# Patient Record
Sex: Female | Born: 2000 | Race: White | Hispanic: No | Marital: Single | Smoking: Never smoker
Health system: Southern US, Community
[De-identification: ages and names within clinical notes are randomized; demographics above are authoritative.]

## PROBLEM LIST (undated history)

## (undated) DIAGNOSIS — F32A Depression, unspecified: Secondary | ICD-10-CM

---

## 2021-03-17 ENCOUNTER — Emergency Department: Payer: 59

## 2021-03-17 ENCOUNTER — Emergency Department
Admission: EM | Admit: 2021-03-17 | Discharge: 2021-03-17 | Disposition: A | Discharge status: Home / Self Care | Payer: 59 | Attending: Emergency Medicine | Admitting: Emergency Medicine

## 2021-03-17 ENCOUNTER — Other Ambulatory Visit: Payer: Self-pay

## 2021-03-17 DIAGNOSIS — M5126 Other intervertebral disc displacement, lumbar region: Secondary | ICD-10-CM | POA: Insufficient documentation

## 2021-03-17 DIAGNOSIS — M79604 Pain in right leg: Secondary | ICD-10-CM | POA: Diagnosis not present

## 2021-03-17 DIAGNOSIS — M79605 Pain in left leg: Secondary | ICD-10-CM | POA: Diagnosis not present

## 2021-03-17 DIAGNOSIS — M545 Low back pain, unspecified: Secondary | ICD-10-CM | POA: Diagnosis present

## 2021-03-17 HISTORY — DX: Depression, unspecified: F32.A

## 2021-03-17 LAB — POC URINE PREG, ED: Preg Test, Ur: NEGATIVE

## 2021-03-17 MED ORDER — IBUPROFEN 600 MG PO TABS: 600.0000 mg | ORAL_TABLET | Freq: Three times a day (TID) | ORAL | 0 refills | Status: AC

## 2021-03-17 MED ORDER — OXYCODONE-ACETAMINOPHEN 5-325 MG PO TABS: 1.0000 | ORAL_TABLET | Freq: Four times a day (QID) | ORAL | 0 refills | Status: AC | PRN

## 2021-03-17 MED ORDER — KETOROLAC TROMETHAMINE 60 MG/2ML IM SOLN
60.0000 mg | Freq: Once | INTRAMUSCULAR | Status: AC
  Administered 2021-03-17: 60 mg via INTRAMUSCULAR
  Filled 2021-03-17: qty 2

## 2021-03-17 MED ORDER — ONDANSETRON 4 MG PO TBDP
4.0000 mg | ORAL_TABLET | Freq: Once | ORAL | Status: AC
  Administered 2021-03-17: 4 mg via ORAL
  Filled 2021-03-17: qty 1

## 2021-03-17 MED ORDER — METHYLPREDNISOLONE 4 MG PO TBPK: ORAL_TABLET | ORAL | 0 refills | Status: DC

## 2021-03-17 MED ORDER — ONDANSETRON 4 MG PO TBDP: 4.0000 mg | ORAL_TABLET | Freq: Three times a day (TID) | ORAL | 0 refills | Status: DC | PRN

## 2021-03-17 MED ORDER — DEXAMETHASONE SODIUM PHOSPHATE 10 MG/ML IJ SOLN
10.0000 mg | Freq: Once | INTRAMUSCULAR | Status: AC
  Administered 2021-03-17: 10 mg via INTRAMUSCULAR
  Filled 2021-03-17: qty 1

## 2021-03-17 MED ORDER — OXYCODONE-ACETAMINOPHEN 5-325 MG PO TABS
2.0000 | ORAL_TABLET | Freq: Once | ORAL | Status: AC
Start: 2021-03-17 — End: 2021-03-17
  Administered 2021-03-17: 2 via ORAL
  Filled 2021-03-17: qty 2

## 2021-03-17 NOTE — ED Provider Notes (Signed)
[facility] Emergency Department Provider Note  ____________________________________________   Event Date/Time   First MD Initiated Contact with Patient 03/17/21 1440     (approximate)  I have reviewed the triage vital signs and the nursing notes.   HISTORY  Chief Complaint Back Pain    HPI Tina Soto is a 20 y.o. female here with back pain.  The patient is a Psychologist, forensic.  She was training today when she threw a medicine ball sideways as part of her workout.  She reports immediate onset of severe, 10 of 10, lower back pain.  She immediately went to her trainer and they tried to work her back at home without significant relief.  She has subsequently had ongoing, severe, lower back pain.  She has had occasional radiation down the legs.  Denies any alleviating factors other than rest.  No history of back injuries.  No other complaints.  No loss of bowel or bladder function.    Past Medical History:  Diagnosis Date   Depression     There are no problems to display for this patient.   History reviewed. No pertinent surgical history.  Prior to Admission medications   Medication Sig Start Date End Date Taking? Authorizing Provider  ibuprofen (ADVIL) 600 MG tablet Take 1 tablet (600 mg total) by mouth 3 (three) times daily for 7 days. 03/17/21 03/24/21 Yes Shaune Pollack, MD  methylPREDNISolone (MEDROL DOSEPAK) 4 MG TBPK tablet Take as directed on packaging 03/17/21  Yes Shaune Pollack, MD  ondansetron (ZOFRAN ODT) 4 MG disintegrating tablet Take 1 tablet (4 mg total) by mouth every 8 (eight) hours as needed for nausea or vomiting. 03/17/21  Yes Shaune Pollack, MD  oxyCODONE-acetaminophen (PERCOCET) 5-325 MG tablet Take 1-2 tablets by mouth every 6 (six) hours as needed for severe pain (no more than 6 tabs daily). 03/17/21 03/17/22 Yes Shaune Pollack, MD    Allergies Patient has no known allergies.  History reviewed. No pertinent family  history.  Social History Social History   Tobacco Use   Smoking status: Never   Smokeless tobacco: Never    Review of Systems  Review of Systems  Constitutional:  Negative for chills and fever.  HENT:  Negative for sore throat.   Respiratory:  Negative for shortness of breath.   Cardiovascular:  Negative for chest pain.  Gastrointestinal:  Negative for abdominal pain.  Genitourinary:  Negative for flank pain.  Musculoskeletal:  Positive for back pain and gait problem (Due to pain). Negative for neck pain.  Skin:  Negative for rash and wound.  Allergic/Immunologic: Negative for immunocompromised state.  Neurological:  Negative for weakness and numbness.  Hematological:  Does not bruise/bleed easily.  All other systems reviewed and are negative.   ____________________________________________  PHYSICAL EXAM:      VITAL SIGNS: ED Triage Vitals  Enc Vitals Group     BP 03/17/21 1339 115/71     Pulse Rate 03/17/21 1339 84     Resp 03/17/21 1745 18     Temp 03/17/21 1339 98.3 F (36.8 C)     Temp Source 03/17/21 1339 Oral     SpO2 03/17/21 1339 98 %     Weight 03/17/21 1340 155 lb (70.3 kg)     Height 03/17/21 1340 6\' 1"  (1.854 m)     Head Circumference --      Peak Flow --      Pain Score 03/17/21 1332 8     Pain Loc --  Pain Edu? --      Excl. in GC? --      Physical Exam Vitals and nursing note reviewed.  Constitutional:      General: She is not in acute distress.    Appearance: She is well-developed.  HENT:     Head: Normocephalic and atraumatic.  Eyes:     Conjunctiva/sclera: Conjunctivae normal.  Cardiovascular:     Rate and Rhythm: Normal rate and regular rhythm.     Heart sounds: Normal heart sounds.  Pulmonary:     Effort: Pulmonary effort is normal. No respiratory distress.     Breath sounds: No wheezing.  Abdominal:     General: There is no distension.  Musculoskeletal:     Cervical back: Neck supple.     Comments: Marked lower back pain,  worse over lumbosacral lower spine.  Skin:    General: Skin is warm.     Capillary Refill: Capillary refill takes less than 2 seconds.     Findings: No rash.  Neurological:     Mental Status: She is alert and oriented to person, place, and time.     Motor: No abnormal muscle tone.     Comments: Strength 5/5 bilateral LE, including distal extremities. Sensation to light touch is intact. Quad, achilles reflexes 2+ and symmetric.      ____________________________________________   LABS (all labs ordered are listed, but only abnormal results are displayed)  Labs Reviewed  POC URINE PREG, ED    ____________________________________________  EKG:  ________________________________________  RADIOLOGY All imaging, including plain films, CT scans, and ultrasounds, independently reviewed by me, and interpretations confirmed via formal radiology reads.  ED MD interpretation:   MR Spine Lumbar: Disc protrusions at L4-5 and L5-S1, with mild lateral recess stenosis  Official radiology report(s): MR LUMBAR SPINE WO CONTRAST  Result Date: 03/17/2021 CLINICAL DATA:  Low back pain. Trauma. Twisting motion with a medicine ball. EXAM: MRI LUMBAR SPINE WITHOUT CONTRAST TECHNIQUE: Multiplanar, multisequence MR imaging of the lumbar spine was performed. No intravenous contrast was administered. COMPARISON:  None. FINDINGS: Segmentation: Normal lumbar segmentation is assumed with the lowest fully formed disc space designated L5-S1. Alignment:  Trace retrolisthesis of L5 on S1. Vertebrae: No fracture, suspicious marrow lesion, or significant marrow edema. Conus medullaris and cauda equina: Conus extends to the L1 level. Conus and cauda equina appear normal. Paraspinal and other soft tissues: Unremarkable. Disc levels: L1-2: Negative. L2-3 and L3-4: Normal discs. Mild facet hypertrophy without stenosis. L4-5: Disc desiccation and mild disc space narrowing. A central disc protrusion and mild right and  mild-to-moderate left facet hypertrophy result in mild spinal and bilateral lateral recess stenosis. Patent neural foramina. L5-S1: Disc desiccation and mild disc space narrowing. A central to left paracentral disc protrusion and slight facet hypertrophy result in mild left lateral recess stenosis without significant generalized spinal stenosis or neural foraminal stenosis. IMPRESSION: 1. Disc protrusions at L4-5 and L5-S1 with mild lateral recess stenosis. 2. Mild multilevel facet hypertrophy. Electronically Signed   By: Sebastian Ache M.D.   On: 03/17/2021 16:54    ____________________________________________  PROCEDURES   Procedure(s) performed (including Critical Care):  Procedures  ____________________________________________  INITIAL IMPRESSION / MDM / ASSESSMENT AND PLAN / ED COURSE  As part of my medical decision making, I reviewed the following data within the electronic MEDICAL RECORD NUMBER Nursing notes reviewed and incorporated, Old chart reviewed, Notes from prior ED visits, and [place] Controlled Substance Database       *Tina Soto was  evaluated in Emergency Department on 03/17/2021 for the symptoms described in the history of present illness. She was evaluated in the context of the global COVID-19 pandemic, which necessitated consideration that the patient might be at risk for infection with the SARS-CoV-2 virus that causes COVID-19. Institutional protocols and algorithms that pertain to the evaluation of patients at risk for COVID-19 are in a state of rapid change based on information released by regulatory bodies including the CDC and federal and state organizations. These policies and algorithms were followed during the patient's care in the ED.  Some ED evaluations and interventions may be delayed as a result of limited staffing during the pandemic.*     Medical Decision Making:  20 yo F here with severe lower back pain. No LE weakness, numbness, loss of bowel or bladder or signs  of cauda equina. Given degree of pain, MR obtained and shows central disc protrusions at L4-5 and L5-S1. Discussed with Dr. Myer Haff via telephone. Will place on steroids, d/c with analgesia, and outpt follow-up. Pt and her trainer notified and in agreement with this plan.  ____________________________________________  FINAL CLINICAL IMPRESSION(S) / ED DIAGNOSES  Final diagnoses:  Lumbar disc herniation     MEDICATIONS GIVEN DURING THIS VISIT:  Medications  dexamethasone (DECADRON) injection 10 mg (has no administration in time range)  ketorolac (TORADOL) injection 60 mg (60 mg Intramuscular Given 03/17/21 1535)  ondansetron (ZOFRAN-ODT) disintegrating tablet 4 mg (4 mg Oral Given 03/17/21 1535)  oxyCODONE-acetaminophen (PERCOCET/ROXICET) 5-325 MG per tablet 2 tablet (2 tablets Oral Given 03/17/21 1535)     ED Discharge Orders          Ordered    methylPREDNISolone (MEDROL DOSEPAK) 4 MG TBPK tablet        03/17/21 1753    oxyCODONE-acetaminophen (PERCOCET) 5-325 MG tablet  Every 6 hours PRN        03/17/21 1753    ondansetron (ZOFRAN ODT) 4 MG disintegrating tablet  Every 8 hours PRN        03/17/21 1753    ibuprofen (ADVIL) 600 MG tablet  3 times daily        03/17/21 1753             Note:  This document was prepared using Dragon voice recognition software and may include unintentional dictation errors.   Shaune Pollack, MD 03/17/21 [phone]

## 2021-03-17 NOTE — ED Triage Notes (Signed)
Pt comes into the ED via [facility] from school c/o low back pain.  PT states she was doing a twisting motion with a medicine ball and now she has low back pain and an all over "heaviness". 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    128/70 70 HR

## 2021-08-31 DIAGNOSIS — Z87820 Personal history of traumatic brain injury: Secondary | ICD-10-CM | POA: Insufficient documentation

## 2022-03-23 DIAGNOSIS — M5416 Radiculopathy, lumbar region: Secondary | ICD-10-CM | POA: Insufficient documentation

## 2022-04-21 ENCOUNTER — Encounter: Payer: Self-pay | Admitting: Family Medicine

## 2022-04-21 ENCOUNTER — Other Ambulatory Visit: Payer: Self-pay | Admitting: Family Medicine

## 2022-04-21 DIAGNOSIS — F4323 Adjustment disorder with mixed anxiety and depressed mood: Secondary | ICD-10-CM

## 2022-04-21 DIAGNOSIS — F4310 Post-traumatic stress disorder, unspecified: Secondary | ICD-10-CM

## 2022-04-21 DIAGNOSIS — F5105 Insomnia due to other mental disorder: Secondary | ICD-10-CM

## 2022-04-21 HISTORY — DX: Adjustment disorder with mixed anxiety and depressed mood: F43.23

## 2022-04-21 HISTORY — DX: Insomnia due to other mental disorder: F51.05

## 2022-05-09 ENCOUNTER — Ambulatory Visit (INDEPENDENT_AMBULATORY_CARE_PROVIDER_SITE_OTHER): Payer: BC Managed Care – PPO | Admitting: Family Medicine

## 2022-05-09 ENCOUNTER — Other Ambulatory Visit: Payer: Self-pay

## 2022-05-09 ENCOUNTER — Encounter: Payer: Self-pay | Admitting: Family Medicine

## 2022-05-09 VITALS — BP 122/58 | HR 113 | Temp 99.3°F | Resp 18 | Ht 73.0 in | Wt 158.0 lb

## 2022-05-09 DIAGNOSIS — N760 Acute vaginitis: Secondary | ICD-10-CM

## 2022-05-09 LAB — POCT WET PREP (WET MOUNT)
Clue Cells Wet Prep Whiff POC: POSITIVE
Trichomonas Wet Prep HPF POC: ABSENT

## 2022-05-09 MED ORDER — METRONIDAZOLE 500 MG PO TABS: 500.0000 mg | ORAL_TABLET | Freq: Two times a day (BID) | ORAL | 0 refills | Status: AC

## 2022-05-09 NOTE — Progress Notes (Signed)
[facility] [address] [place], [place] [address] Phone: [phone] Fax: [phone]   Office Visit Note  Patient Name: Tina Soto  Date of [id number]:[id number]  Med Rec number [id number]  Date of Service: 05/09/2022  Patient has no known allergies.  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   traZODone (DESYREL) 50 MG tablet TAKE 1 AT BEDTIME AS NEEDED   [DISCONTINUED] ondansetron (ZOFRAN ODT) 4 MG disintegrating tablet Take 1 tablet (4 mg total) by mouth every 8 (eight) hours as needed for nausea or vomiting.   No facility-administered encounter medications on file as of 05/09/2022.      Medical History: Past Medical History:  Diagnosis Date   Adjustment disorder with mixed anxiety and depressed mood 04/21/2022   Depression    Insomnia related to another mental disorder 04/21/2022     Vital Signs: BP (!) 122/58   Pulse (!) 113   Temp 99.3 F (37.4 C) (Tympanic)   Resp 18   Ht 6\' 1"  (1.854 m)   Wt 158 lb (71.7 kg)   SpO2 99%   BMI 20.85 kg/m    Review of Systems  Constitutional: Negative.   Genitourinary:        See HPI  Musculoskeletal:        See HPI    Physical Exam Constitutional:      Appearance: Normal appearance. She is normal  weight.  Genitourinary:    Exam position: Knee-chest position.     Labia:        Right: No rash.        Left: No rash.      Comments: Watery discharge Neurological:     Mental Status: She is alert.     Assessment/Plan:  1. Acute vaginitis Has recurrent bacterial vaginitis - metroNIDAZOLE (FLAGYL) 500 MG tablet; Take 1 tablet (500 mg total) by mouth 2 (two) times daily for 7 days.  Dispense: 14 tablet; Refill: 0 - Chlamydia/Gonococcus/Trichomonas, NAA - POCT Wet Prep [facility])    Results for orders placed or performed in visit on 05/09/22 (from the past 24 hour(s))  POCT Wet Prep [facility])     Status: Abnormal   Collection Time: 05/09/22  3:46 PM  Result Value Ref Range   Source Wet Prep POC vagina    WBC, Wet Prep HPF POC     Bacteria Wet Prep HPF POC Few Few   BACTERIA WET PREP MORPHOLOGY POC     Clue Cells Wet Prep HPF POC Few (A) None   Clue Cells Wet Prep Whiff POC Positive Whiff    Yeast Wet Prep HPF POC None None   KOH Wet Prep POC None None   Trichomonas Wet Prep HPF POC Absent Absent  2. Recheck for gonorrhea and chlamydia and include trichomonas     General Counseling: Aaleyah verbalizes understanding of the findings of todays visit and agrees with plan of treatment. I have discussed any further diagnostic evaluation that may be needed or ordered today. We also reviewed her medications today. she has been encouraged to call the office with any questions or concerns that should arise related to todays visit.   Orders Placed This Encounter  Procedures   Chlamydia/Gonococcus/Trichomonas, NAA   POCT Wet Prep ([place] [place])    Meds ordered this encounter  Medications   metroNIDAZOLE (FLAGYL) 500 MG tablet    Sig: Take 1 tablet (500 mg total) by mouth 2 (two) times daily for 7 days.    Dispense:  14 tablet    Refill:  0     Dr Evette Doffing Fordyce Lepak ABFM University Physician

## 2022-05-11 LAB — CHLAMYDIA/GONOCOCCUS/TRICHOMONAS, NAA
Chlamydia by NAA: NEGATIVE
Gonococcus by NAA: NEGATIVE
Trich vag by NAA: NEGATIVE

## 2022-09-26 ENCOUNTER — Encounter: Payer: Self-pay | Admitting: Family Medicine

## 2022-09-26 ENCOUNTER — Ambulatory Visit (INDEPENDENT_AMBULATORY_CARE_PROVIDER_SITE_OTHER): Payer: BC Managed Care – PPO | Admitting: Family Medicine

## 2022-09-26 VITALS — BP 112/64 | HR 89 | Temp 97.6°F | Resp 18 | Ht 73.0 in | Wt 160.0 lb

## 2022-09-26 DIAGNOSIS — B351 Tinea unguium: Secondary | ICD-10-CM

## 2022-09-26 MED ORDER — TERBINAFINE HCL 250 MG PO TABS: 250.0000 mg | ORAL_TABLET | Freq: Every day | ORAL | 0 refills | Status: AC

## 2022-09-26 NOTE — Progress Notes (Signed)
[address] [place], [place] [address] Phone: [phone] Fax: [phone]   Office Visit Note  Patient Name: Tina Soto  Date of [id number]  Med Rec number [id number]  Date of Service: 09/26/2022  Patient has no known allergies.  Chief Complaint  Patient presents with   Nail Problem     Noticed discoloration and cracking of toenails in December Has used an OTC antifungal pen with no response Has had pedicures in the past but none recently      Current Medication:  Outpatient Encounter Medications as of 09/26/2022  Medication Sig   citalopram (CELEXA) 20 MG tablet TAKE 1 TABLET BY MOUTH DAILYWITH BREAKFAST   traZODone (DESYREL) 50 MG tablet TAKE 1 AT BEDTIME AS NEEDED   [DISCONTINUED] methylPREDNISolone (MEDROL DOSEPAK) 4 MG TBPK tablet Take as directed on packaging   No facility-administered encounter medications on file as of 09/26/2022.      Medical History: Past Medical History:  Diagnosis Date   Adjustment disorder with mixed anxiety and depressed mood 04/21/2022   Depression    Insomnia related to another mental disorder 04/21/2022     Vital Signs: BP 112/64   Pulse 89   Temp 97.6 F (36.4 C) (Tympanic)   Resp 18   Ht 6' 1"$  (1.854 m)   Wt 160 lb (72.6 kg)   SpO2 99%   BMI 21.11 kg/m    Review of Systems  Constitutional: Negative.   Skin:        See HPI    Physical Exam Feet:     Right foot:     Skin integrity: Fissure present.     Toenail Condition: Fungal disease present.    Left foot:     Skin integrity: Fissure present.     Toenail Condition: Fungal disease present.    Comments: Toes 3, 4, 4 and 5 of both feet with white linear changes and some yellowing and thickening consistent with fungal infection  Minimal cracking and fissure between toes   Assessment/Plan:  1. Onychomycosis Tina Soto is aware that toenails grow very slowly so it will take 3 months or more before all nails are back to normal and  she will not use nail polish until they are normal again - terbinafine (LAMISIL) 250 MG tablet; Take 1 tablet (250 mg total) by mouth daily.  Dispense: 90 tablet; Refill: 0      General Counseling: Tina Soto verbalizes understanding of the findings of todays visit and agrees with plan of treatment. I have discussed any further diagnostic evaluation that may be needed or ordered today. We also reviewed her medications today. she has been encouraged to call the office with any questions or concerns that should arise related to todays visit.   No orders of the defined types were placed in this encounter.   No orders of the defined types were placed in this encounter.    Dr Evette Doffing Ayelen Sciortino ABFM University Physician

## 2022-11-28 ENCOUNTER — Encounter: Payer: Self-pay | Admitting: Family Medicine

## 2022-11-28 ENCOUNTER — Ambulatory Visit (INDEPENDENT_AMBULATORY_CARE_PROVIDER_SITE_OTHER): Payer: BC Managed Care – PPO | Admitting: Family Medicine

## 2022-11-28 VITALS — BP 122/74 | HR 73 | Temp 97.9°F

## 2022-11-28 DIAGNOSIS — Z30431 Encounter for routine checking of intrauterine contraceptive device: Secondary | ICD-10-CM | POA: Diagnosis not present

## 2022-11-28 NOTE — Progress Notes (Signed)
[facility] [address] [place], [place] [address] Phone: [phone] Fax: [phone]   Office Visit Note  Patient Name: Tina Soto  Date of [id number]  Med Rec number [id number]  Date of Service: 11/28/2022  Patient has no known allergies.  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   citalopram (CELEXA) 20 MG tablet TAKE 1 TABLET BY MOUTH DAILYWITH BREAKFAST (Patient not taking: Reported on 11/28/2022)   traZODone (DESYREL) 50 MG tablet TAKE 1 AT BEDTIME AS NEEDED (Patient not taking: Reported on 11/28/2022)   No facility-administered encounter medications on file as of 11/28/2022.      Medical History: Past Medical History:  Diagnosis Date   Adjustment disorder with mixed anxiety and depressed mood 04/21/2022   Depression    Insomnia related to another mental disorder 04/21/2022     Vital Signs: BP 122/74   Pulse 73   Temp 97.9 F (36.6 C) (Tympanic)   SpO2 98%    Review of Systems  Genitourinary:  Positive for dyspareunia.  Psychiatric/Behavioral:         Doing well Takes trazodone every few weeks only    Physical Exam Vitals reviewed.  Constitutional:      Appearance: Normal appearance.  Genitourinary:    Exam position: Lithotomy position.     Vagina: Normal.     Cervix: Normal.     Uterus: Normal.      Comments: String visible, no dilation of external os - small nabothian cyst String length normal IUD end not palpable Neurological:     Mental Status: She is alert.      Assessment/Plan:  1. IUD check up No concerns      General Counseling: Tina Soto verbalizes understanding of the findings of todays visit and agrees with plan of treatment. I have discussed any further diagnostic evaluation that may be needed or ordered today. We also reviewed her medications today. she has been encouraged to call the office with any questions or concerns that should arise related to todays visit.   No orders of the defined types were placed in this encounter.   No orders of the defined types were placed in this encounter.   Dr Durwin Reges Aylinn Rydberg ABFM University Physician

## 2022-12-06 ENCOUNTER — Other Ambulatory Visit: Payer: Self-pay | Admitting: Family Medicine

## 2022-12-06 ENCOUNTER — Encounter: Payer: Self-pay | Admitting: Family Medicine

## 2022-12-06 DIAGNOSIS — Z2839 Other underimmunization status: Secondary | ICD-10-CM

## 2022-12-14 ENCOUNTER — Encounter: Payer: Self-pay | Admitting: Family Medicine

## 2022-12-14 ENCOUNTER — Ambulatory Visit (INDEPENDENT_AMBULATORY_CARE_PROVIDER_SITE_OTHER): Payer: BC Managed Care – PPO | Admitting: Family Medicine

## 2022-12-14 VITALS — BP 120/72 | HR 111 | Temp 98.8°F | Ht 73.0 in | Wt 152.0 lb

## 2022-12-14 DIAGNOSIS — J069 Acute upper respiratory infection, unspecified: Secondary | ICD-10-CM | POA: Diagnosis not present

## 2022-12-14 DIAGNOSIS — R29898 Other symptoms and signs involving the musculoskeletal system: Secondary | ICD-10-CM

## 2022-12-14 DIAGNOSIS — H9202 Otalgia, left ear: Secondary | ICD-10-CM

## 2022-12-14 NOTE — Progress Notes (Signed)
[facility] [address] [place], [place] [address] Phone: [phone] Fax: [phone]   Office Visit Note  Patient Name: Tina Soto  Date of [id number]  Med Rec number [id number]  Date of Service: 12/14/2022  Patient has no known allergies.  Chief Complaint  Patient presents with   Ear Pain    Left      Has had head fog for a few days and then left ear pain today No alteration in hearing  No fever Feels somewhat better after OTC cold and sinus medication this morning  Also has intermittent recurrnet jaw pain      Current Medication:  Outpatient Encounter Medications as of 12/14/2022  Medication Sig   terbinafine (LAMISIL) 250 MG tablet Take 1 tablet (250 mg total) by mouth daily. (Patient not taking: Reported on 12/14/2022)   traZODone (DESYREL) 50 MG tablet TAKE 1 AT BEDTIME AS NEEDED (Patient not taking: Reported on 11/28/2022)   No facility-administered encounter medications on file as of 12/14/2022.      Medical History: Past Medical History:  Diagnosis Date   Adjustment disorder with mixed anxiety and depressed mood 04/21/2022   Depression    Insomnia related to another mental disorder 04/21/2022     Vital Signs: BP 120/72   Pulse (!) 111   Temp 98.8 F (37.1 C) (Tympanic)   Ht 6\' 1"  (1.854 m)   Wt 152 lb (68.9 kg)   SpO2 99%   BMI 20.05 kg/m    Review of Systems  Constitutional:  Negative for fever.  HENT:  Positive for ear pain.   Genitourinary:  Negative for genital sores.    Physical Exam Vitals reviewed.  Constitutional:      Appearance: Normal appearance.  HENT:     Head:     Jaw: Tenderness and pain on movement present.     Comments: TMJ clicking especially on left    Right Ear: A middle ear effusion is present.     Left Ear: A middle ear effusion is present.     Nose: Rhinorrhea present. Rhinorrhea is clear.     Right Turbinates: Swollen.     Left Turbinates: Swollen.     Right Sinus: Maxillary sinus  tenderness present. No frontal sinus tenderness.     Left Sinus: Maxillary sinus tenderness present. No frontal sinus tenderness.     Mouth/Throat:     Pharynx: Oropharynx is clear. Uvula midline. No oropharyngeal exudate, posterior oropharyngeal erythema or uvula swelling.  Eyes:     Conjunctiva/sclera: Conjunctivae normal.  Pulmonary:     Effort: Pulmonary effort is normal.     Breath sounds: Normal breath sounds and air entry.  Neurological:     Mental Status: She is alert.      Assessment/Plan:  1. Otalgia of left ear Due to pressure from fluid and referred from TMJ  2. Acute upper respiratory infection Viral - use fluticasone nasal spray, 2 sprays to each nostril two times daily Continue OTC cold meds  3. TMJ click stable      General Counseling: Tina Soto verbalizes understanding of the findings of todays visit and agrees with plan of treatment. I have discussed any further diagnostic evaluation that may be needed or ordered today. We also reviewed her medications today. she has been encouraged to call the office with any questions or concerns that should arise related to todays visit.   No orders of the defined types were placed in this encounter.   No orders of the  defined types were placed in this encounter.    Dr Evette Doffing Luke Rigsbee ABFM University Physician

## 2023-03-14 IMAGING — MR MR LUMBAR SPINE W/O CM
5 series · 31 of 48 positions shown · non-contrast
Comparison: None.

CLINICAL DATA: Low back pain. Trauma. Twisting motion with a
medicine ball.

EXAM:
MRI LUMBAR SPINE WITHOUT CONTRAST
TECHNIQUE: Multiplanar, multisequence MR imaging of the lumbar spine was
performed. No intravenous contrast was administered.

[Series 5: T2 · sagittal · 4.0mm · 0.81mm/px · 6 of 17 slices shown (1 of 2)]
[im 1/17]
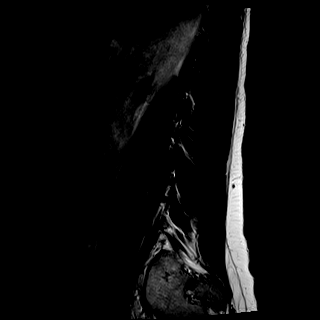
[im 4/17]
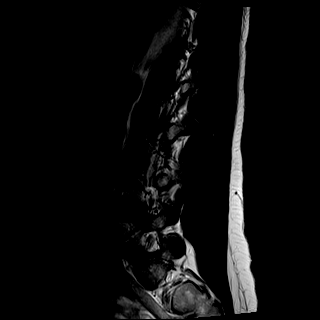
[im 7/17]
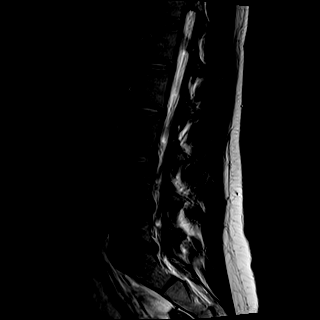
[im 10/17]
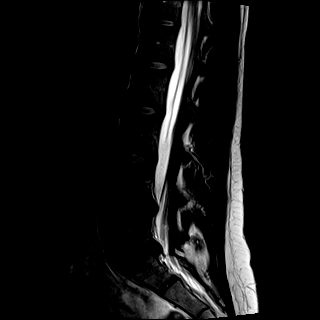
[im 13/17]
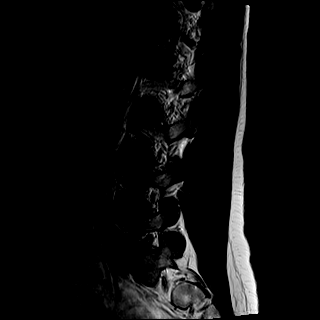
[im 17/17]
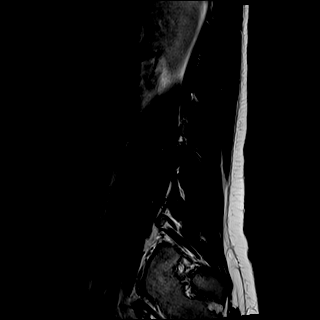

[Series 6: T1 · sagittal · 4.0mm · 0.81mm/px · 7 of 17 slices shown (1 of 2)]
[im 1/17]
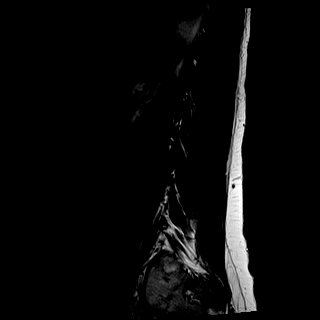
[im 3/17]
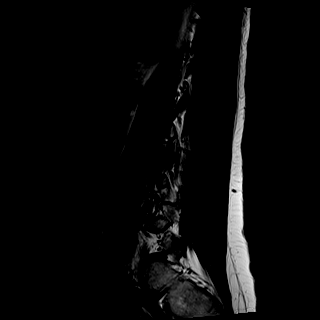
[im 6/17]
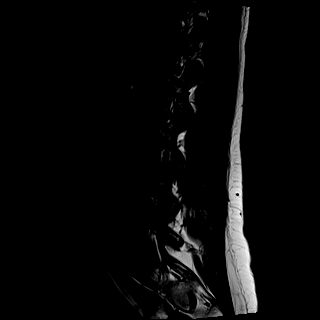
[im 9/17]
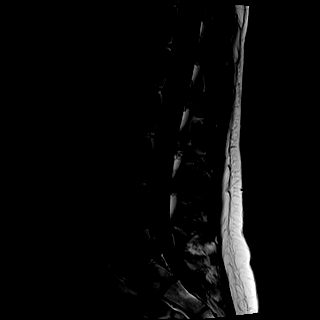
[im 11/17]
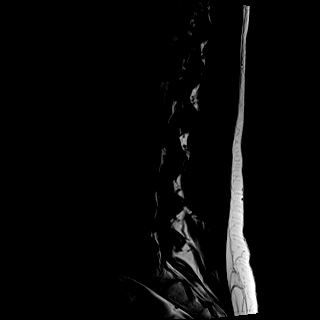
[im 14/17]
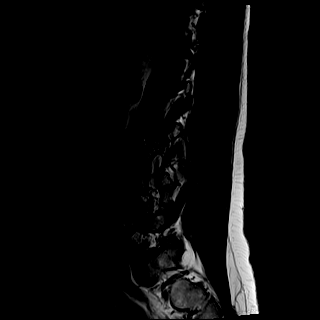
[im 17/17]
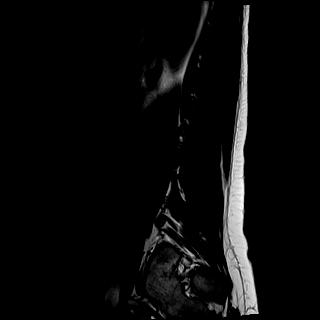

[Series 7: STIR · sagittal · 4.0mm · 0.41mm/px · 2 of 17 slices shown]
[im 1/17]
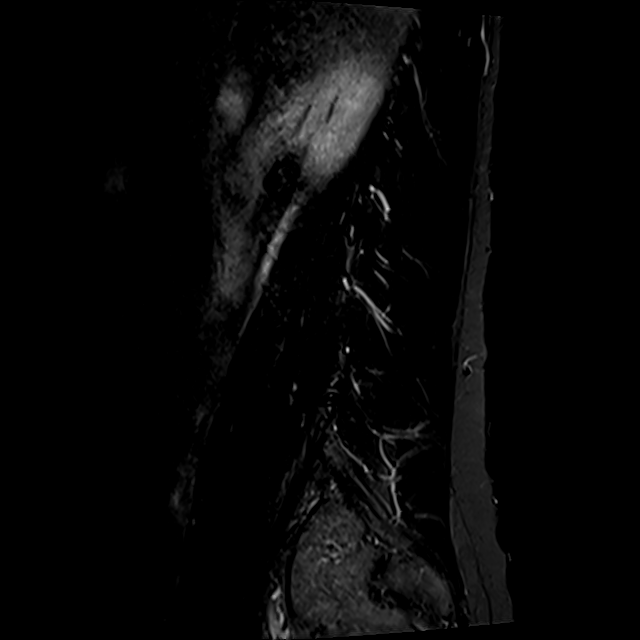
[im 3/17]
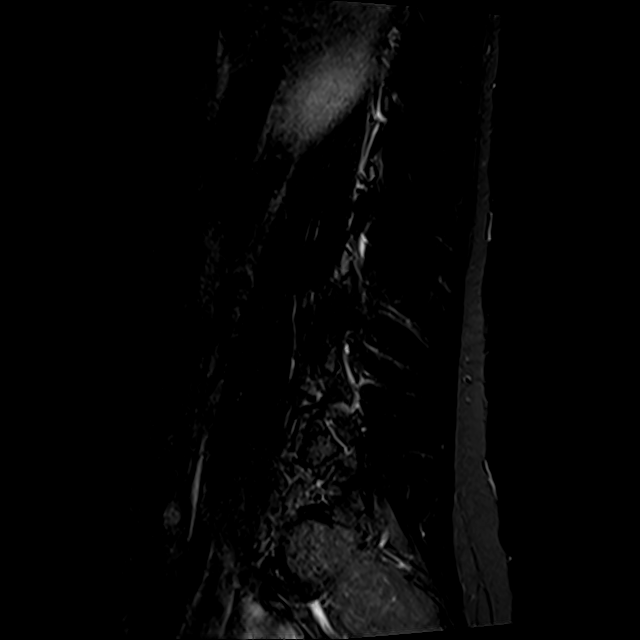

[Series 8: T2 · axial · 4.0mm · 0.78mm/px · z∈[-88,+109]mm · 8 of 36 slices shown (2 of 2)]
[im 1/36]
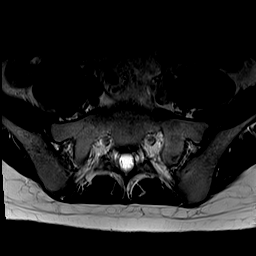
[im 6/36]
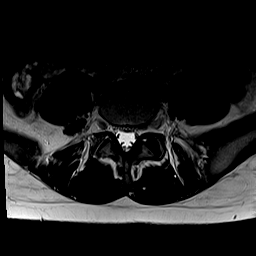
[im 11/36]
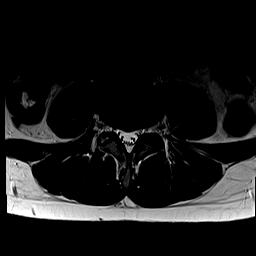
[im 17/36]
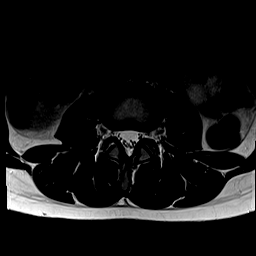
[im 19/36]
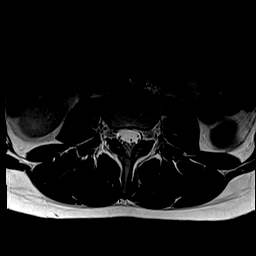
[im 25/36]
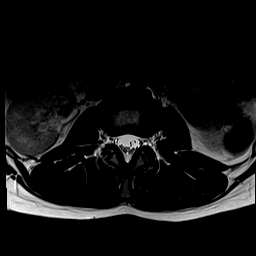
[im 30/36]
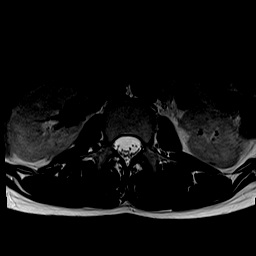
[im 36/36]
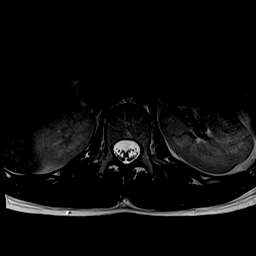

[Series 9: T1 · axial · 4.0mm · 0.39mm/px · z∈[-88,+109]mm · 8 of 36 slices shown (2 of 2)]
[im 1/36]
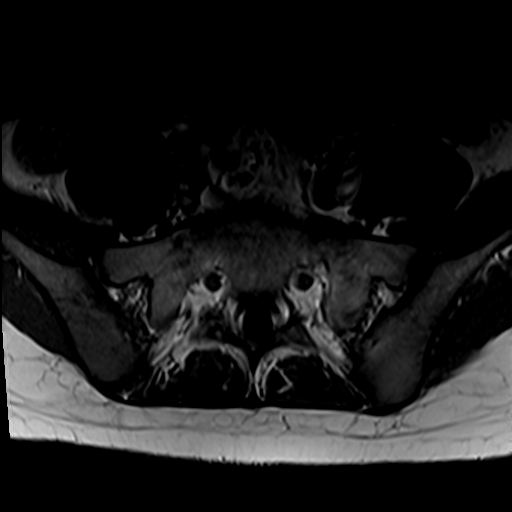
[im 6/36]
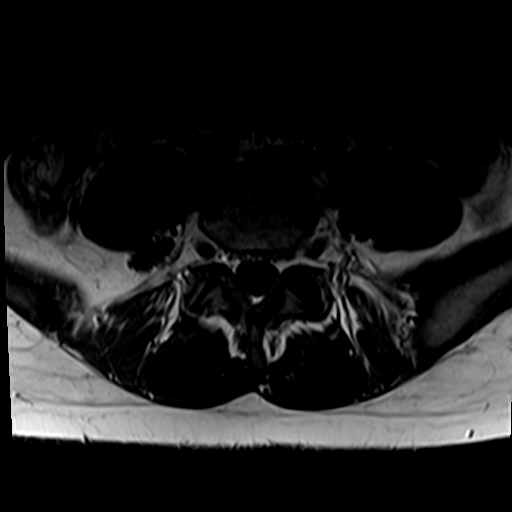
[im 11/36]
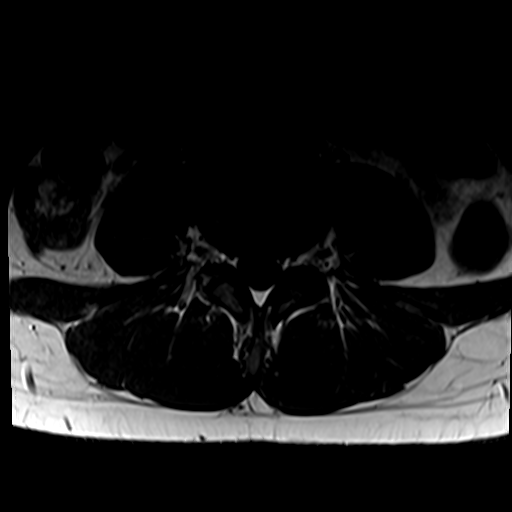
[im 17/36]
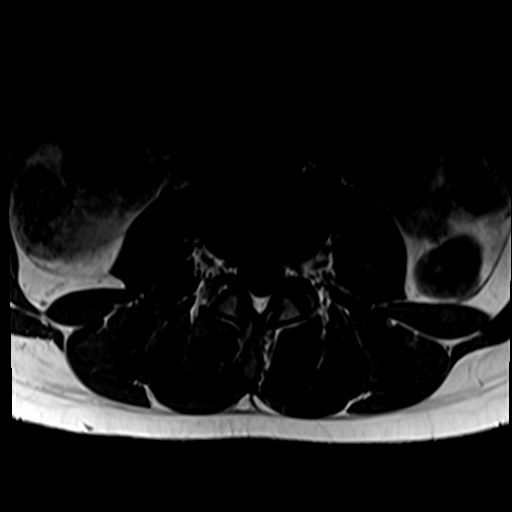
[im 19/36]
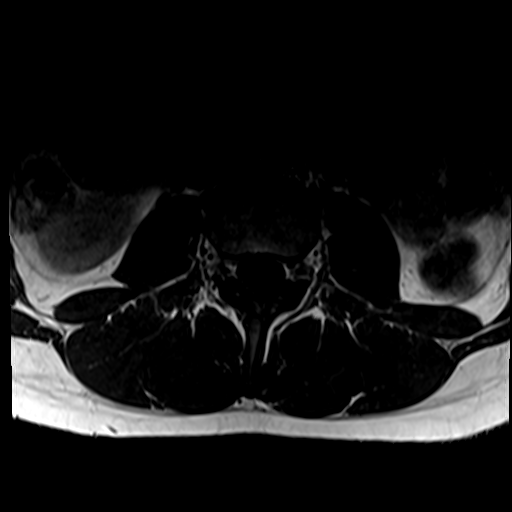
[im 25/36]
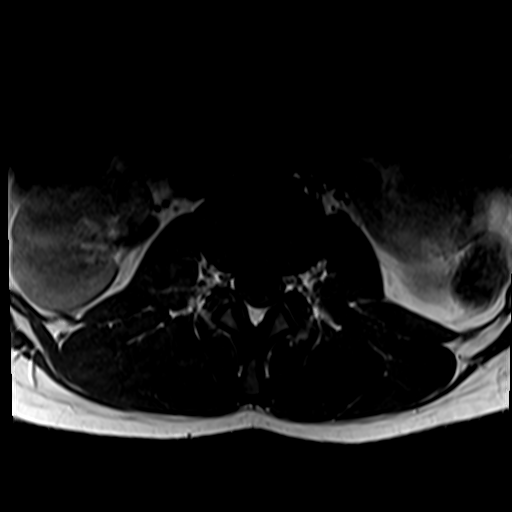
[im 30/36]
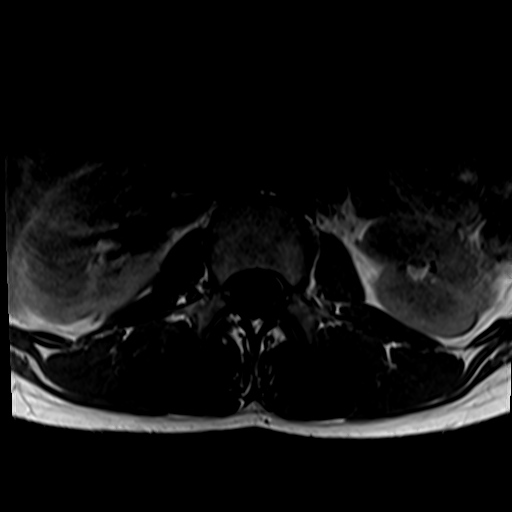
[im 36/36]
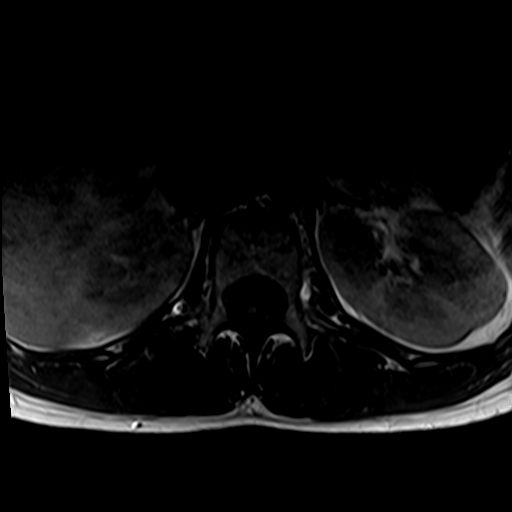

[31 of 48 positions shown; findings below may reference images not displayed]

FINDINGS: Segmentation: Normal lumbar segmentation is assumed with the lowest
fully formed disc space designated L5-S1.

Alignment:  Trace retrolisthesis of L5 on S1.

Vertebrae: No fracture, suspicious marrow lesion, or significant
marrow edema.

Conus medullaris and cauda equina: Conus extends to the L1 level.
Conus and cauda equina appear normal.

Paraspinal and other soft tissues: Unremarkable.

Disc levels:

L1-2: Negative.

L2-3 and L3-4: Normal discs. Mild facet hypertrophy without
stenosis.

L4-5: Disc desiccation and mild disc space narrowing. A central disc
protrusion and mild right and mild-to-moderate left facet
hypertrophy result in mild spinal and bilateral lateral recess
stenosis. Patent neural foramina.

L5-S1: Disc desiccation and mild disc space narrowing. A central to
left paracentral disc protrusion and slight facet hypertrophy result
in mild left lateral recess stenosis without significant generalized
spinal stenosis or neural foraminal stenosis.
IMPRESSION: 1. Disc protrusions at L4-5 and L5-S1 with mild lateral recess
stenosis.
2. Mild multilevel facet hypertrophy.
